# Patient Record
Sex: Female | Born: 1995 | Race: White | Hispanic: No | Marital: Single | Smoking: Never smoker
Health system: Southern US, Community
[De-identification: ages and names within clinical notes are randomized; demographics above are authoritative.]

---

## 2017-12-13 ENCOUNTER — Other Ambulatory Visit: Payer: Self-pay

## 2017-12-13 ENCOUNTER — Encounter (HOSPITAL_BASED_OUTPATIENT_CLINIC_OR_DEPARTMENT_OTHER): Payer: Self-pay | Admitting: Emergency Medicine

## 2017-12-13 ENCOUNTER — Emergency Department (HOSPITAL_BASED_OUTPATIENT_CLINIC_OR_DEPARTMENT_OTHER)
Admission: EM | Admit: 2017-12-13 | Discharge: 2017-12-14 | Disposition: A | Discharge status: Home / Self Care | Payer: Managed Care, Other (non HMO) | Attending: Emergency Medicine | Admitting: Emergency Medicine

## 2017-12-13 DIAGNOSIS — Z3202 Encounter for pregnancy test, result negative: Secondary | ICD-10-CM | POA: Insufficient documentation

## 2017-12-13 DIAGNOSIS — R000 Tachycardia, unspecified: Secondary | ICD-10-CM | POA: Diagnosis not present

## 2017-12-13 DIAGNOSIS — R7989 Other specified abnormal findings of blood chemistry: Secondary | ICD-10-CM | POA: Insufficient documentation

## 2017-12-13 DIAGNOSIS — R112 Nausea with vomiting, unspecified: Secondary | ICD-10-CM | POA: Diagnosis present

## 2017-12-13 DIAGNOSIS — K529 Noninfective gastroenteritis and colitis, unspecified: Secondary | ICD-10-CM | POA: Insufficient documentation

## 2017-12-13 MED ORDER — ONDANSETRON HCL 4 MG/2ML IJ SOLN
4.0000 mg | Freq: Once | INTRAMUSCULAR | Status: AC
  Administered 2017-12-14: 4 mg via INTRAVENOUS
  Filled 2017-12-13: qty 2

## 2017-12-13 MED ORDER — SODIUM CHLORIDE 0.9 % IV BOLUS
1000.0000 mL | Freq: Once | INTRAVENOUS | Status: AC
  Administered 2017-12-14: 1000 mL via INTRAVENOUS

## 2017-12-13 NOTE — ED Triage Notes (Signed)
Patient brought in via ems for N/V/D - was found to have hypotension at Home and was given 2 500cc bolus. The patient currently is normal tensive after the fluid. The patient is a/o x 4

## 2017-12-13 NOTE — ED Provider Notes (Signed)
[facility] EMERGENCY DEPARTMENT Provider Note   CSN: [id number] Arrival date & time: 12/13/17  2319     History   Chief Complaint No chief complaint on file.   HPI Helen Bell is a 22 y.o. female.  High [employer] student presenting by EMS with sudden onset nausea vomiting diarrhea and abdominal pain.  She states she believes she has food poisoning after eating pork in the cafeteria.  No other sick contacts.  No recent travel or antibiotic use.  She states she got sick around 8 PM and is vomited too many times to count associate with diarrhea.  She did have a syncopal episode on EMS arrival.  Denies hitting her head.  Denies any chest pain or shortness of breath.   denies any possibility of pregnancy.  Denies any other medical problems.  The history is provided by the patient and the EMS personnel.    History reviewed. No pertinent past medical history.  There are no active problems to display for this patient.   History reviewed. No pertinent surgical history.   OB History   None      Home Medications    Prior to Admission medications   Not on File    Family History History reviewed. No pertinent family history.  Social History Social History   Tobacco Use  . Smoking status: Never Smoker  . Smokeless tobacco: Never Used  Substance Use Topics  . Alcohol use: Never    Frequency: Never  . Drug use: Never     Allergies   Patient has no known allergies.   Review of Systems Review of Systems  Constitutional: Positive for activity change and appetite change. Negative for fever.  HENT: Negative for congestion.   Respiratory: Negative for cough, chest tightness and shortness of breath.   Gastrointestinal: Positive for abdominal pain, diarrhea, nausea and vomiting.  Genitourinary: Negative for dysuria and hematuria.  Musculoskeletal: Negative for myalgias.  Skin: Negative for rash.  Neurological: Positive for syncope and weakness.  Negative for dizziness and headaches.   all other systems are negative except as noted in the HPI and PMH.     Physical Exam Updated Vital Signs BP 95/62 (BP Location: Left Arm)   Pulse (!) 106   Temp 98.4 F (36.9 C) (Oral)   Resp 16   SpO2 98%   Physical Exam  Constitutional: She is oriented to person, place, and time. She appears well-developed and well-nourished. No distress.  Actively vomiting, pale appearing  HENT:  Head: Normocephalic and atraumatic.  Mouth/Throat: Oropharynx is clear and moist. No oropharyngeal exudate.  Eyes: Pupils are equal, round, and reactive to light. Conjunctivae and EOM are normal.  Neck: Normal range of motion. Neck supple.  No meningismus.  Cardiovascular: Normal rate, normal heart sounds and intact distal pulses.  No murmur heard. Tachycardic 120s  Pulmonary/Chest: Effort normal and breath sounds normal. No respiratory distress.  Abdominal: Soft. There is no tenderness. There is no rebound and no guarding.  Musculoskeletal: Normal range of motion. She exhibits no edema or tenderness.  Neurological: She is alert and oriented to person, place, and time. No cranial nerve deficit. She exhibits normal muscle tone. Coordination normal.   5/5 strength throughout. CN 2-12 intact.Equal grip strength.   Skin: Skin is warm.  Psychiatric: She has a normal mood and affect. Her behavior is normal.  Nursing note and vitals reviewed.    ED Treatments / Results  Labs (all labs ordered are listed, but only abnormal  results are displayed) Labs Reviewed  CBC WITH DIFFERENTIAL/PLATELET - Abnormal; Notable for the following components:      Result Value   WBC 11.9 (*)    Neutro Abs 9.9 (*)    All other components within normal limits  COMPREHENSIVE METABOLIC PANEL - Abnormal; Notable for the following components:   Glucose, Bld 139 (*)    Calcium 8.5 (*)    All other components within normal limits  URINALYSIS, ROUTINE W REFLEX MICROSCOPIC - Abnormal;  Notable for the following components:   APPearance CLOUDY (*)    Specific Gravity, Urine >1.030 (*)    Hgb urine dipstick MODERATE (*)    Ketones, ur 15 (*)    Protein, ur 30 (*)    All other components within normal limits  URINALYSIS, MICROSCOPIC (REFLEX) - Abnormal; Notable for the following components:   Bacteria, UA RARE (*)    Squamous Epithelial / LPF 6-30 (*)    All other components within normal limits  D-DIMER, QUANTITATIVE (NOT AT [facility]) - Abnormal; Notable for the following components:   D-Dimer, Quant 1.02 (*)    All other components within normal limits  LIPASE, BLOOD  PREGNANCY, URINE    EKG EKG Interpretation  Date/Time:  Tuesday December 14 2017 08:15:30 EDT Ventricular Rate:  107 PR Interval:    QRS Duration: 88 QT Interval:  336 QTC Calculation: 449 R Axis:   44 Text Interpretation:  Sinus tachycardia No old tracing to compare Confirmed by Melene Plan [phone]) on 12/14/2017 8:38:23 AM   Radiology Dg Chest 2 View  Result Date: 12/14/2017 CLINICAL DATA:  22 year old female with nausea and shortness of breath. EXAM: CHEST - 2 VIEW COMPARISON:  None. FINDINGS: The heart size and mediastinal contours are within normal limits. Both lungs are clear. The visualized skeletal structures are unremarkable. IMPRESSION: No active cardiopulmonary disease. Electronically Signed   By: Elgie Collard M.D.   On: 12/14/2017 05:51    Procedures Procedures (including critical care time)  Medications Ordered in ED Medications  sodium chloride 0.9 % bolus 1,000 mL (has no administration in time range)  ondansetron (ZOFRAN) injection 4 mg (has no administration in time range)     Initial Impression / Assessment and Plan / ED Course  I have reviewed the triage vital signs and the nursing notes.  Pertinent labs & imaging results that were available during my care of the patient were reviewed by me and considered in my medical decision making (see chart for details).    4 hours  of nausea, vomiting, diarrhea.  Suspect viral gastroenteritis versus foodborne illness.  Abdomen is benign.  Patient will be hydrated and treated symptomatically.  Patient given IV fluids and antiemetics.  Initially tachycardic in the 130s.  This is improved with fluids.  Blood pressure has stable.  Abdomen is soft and nontender.  Labs are reassuring.  Patient tolerating p.o. without further vomiting in the ED.  On attempted discharge, patient states she said cough and URI symptoms for the past 1 week.  She states she feels like her "lungs are heavy" and she has trouble taking a deep breath. She is on birth control and is persistently tachycardic despite fluid resuscitation.  D-dimer was obtained and is positive.  Will plan for CT angiogram to rule out PE.  However CT is not functioning at this time.  Patient may need to be transferred for CT if not available.  Dr. Adela Lank to assume care at shift change.    Final Clinical Impressions(s) /  ED Diagnoses   Final diagnoses:  Gastroenteritis  Tachycardia  Positive D-dimer    ED Discharge Orders        Ordered    ondansetron (ZOFRAN) 4 MG tablet  Every 6 hours     12/14/17 0801       Glynn Octaveancour, Aanya Haynes, MD 12/14/17 [phone]

## 2017-12-14 ENCOUNTER — Emergency Department (HOSPITAL_BASED_OUTPATIENT_CLINIC_OR_DEPARTMENT_OTHER): Payer: Managed Care, Other (non HMO)

## 2017-12-14 ENCOUNTER — Emergency Department (HOSPITAL_COMMUNITY): Payer: Managed Care, Other (non HMO)

## 2017-12-14 ENCOUNTER — Encounter (HOSPITAL_COMMUNITY): Payer: Self-pay | Admitting: Radiology

## 2017-12-14 LAB — CBC WITH DIFFERENTIAL/PLATELET
BASOS ABS: 0 10*3/uL (ref 0.0–0.1)
Basophils Relative: 0 %
Eosinophils Absolute: 0.1 10*3/uL (ref 0.0–0.7)
Eosinophils Relative: 1 %
HEMATOCRIT: 39.9 % (ref 36.0–46.0)
Hemoglobin: 13.7 g/dL (ref 12.0–15.0)
LYMPHS ABS: 0.8 10*3/uL (ref 0.7–4.0)
LYMPHS PCT: 7 %
MCH: 30.2 pg (ref 26.0–34.0)
MCHC: 34.3 g/dL (ref 30.0–36.0)
MCV: 88.1 fL (ref 78.0–100.0)
MONO ABS: 1 10*3/uL (ref 0.1–1.0)
MONOS PCT: 9 %
NEUTROS ABS: 9.9 10*3/uL — AB (ref 1.7–7.7)
Neutrophils Relative %: 83 %
Platelets: 289 10*3/uL (ref 150–400)
RBC: 4.53 MIL/uL (ref 3.87–5.11)
RDW: 12.1 % (ref 11.5–15.5)
WBC: 11.9 10*3/uL — ABNORMAL HIGH (ref 4.0–10.5)

## 2017-12-14 LAB — URINALYSIS, ROUTINE W REFLEX MICROSCOPIC
Bilirubin Urine: NEGATIVE
GLUCOSE, UA: NEGATIVE mg/dL
KETONES UR: 15 mg/dL — AB
Leukocytes, UA: NEGATIVE
Nitrite: NEGATIVE
PROTEIN: 30 mg/dL — AB
Specific Gravity, Urine: >1.03 — ABNORMAL HIGH (ref 1.005–1.030)
pH: 6 (ref 5.0–8.0)

## 2017-12-14 LAB — COMPREHENSIVE METABOLIC PANEL
ALT: 16 U/L (ref 14–54)
AST: 21 U/L (ref 15–41)
Albumin: 3.5 g/dL (ref 3.5–5.0)
Alkaline Phosphatase: 48 U/L (ref 38–126)
Anion gap: 12 (ref 5–15)
BUN: 16 mg/dL (ref 6–20)
CO2: 22 mmol/L (ref 22–32)
CREATININE: 0.88 mg/dL (ref 0.44–1.00)
Calcium: 8.5 mg/dL — ABNORMAL LOW (ref 8.9–10.3)
Chloride: 104 mmol/L (ref 101–111)
GFR calc Af Amer: >60 mL/min (ref >60)
Glucose, Bld: 139 mg/dL — ABNORMAL HIGH (ref 65–99)
POTASSIUM: 3.9 mmol/L (ref 3.5–5.1)
Sodium: 138 mmol/L (ref 135–145)
Total Bilirubin: 0.3 mg/dL (ref 0.3–1.2)
Total Protein: 7.1 g/dL (ref 6.5–8.1)

## 2017-12-14 LAB — URINALYSIS, MICROSCOPIC (REFLEX): WBC, UA: NONE SEEN WBC/hpf (ref 0–5)

## 2017-12-14 LAB — LIPASE, BLOOD: LIPASE: 27 U/L (ref 11–51)

## 2017-12-14 LAB — PREGNANCY, URINE: Preg Test, Ur: NEGATIVE

## 2017-12-14 LAB — D-DIMER, QUANTITATIVE (NOT AT ARMC): D DIMER QUANT: 1.02 ug{FEU}/mL — AB (ref 0.00–0.50)

## 2017-12-14 MED ORDER — IOPAMIDOL (ISOVUE-370) INJECTION 76%
INTRAVENOUS | Status: AC
  Administered 2017-12-14: 100 mL via INTRAVENOUS
  Filled 2017-12-14: qty 100

## 2017-12-14 MED ORDER — PROMETHAZINE HCL 25 MG/ML IJ SOLN
25.0000 mg | Freq: Once | INTRAMUSCULAR | Status: AC
  Administered 2017-12-14: 25 mg via INTRAVENOUS
  Filled 2017-12-14: qty 1

## 2017-12-14 MED ORDER — SODIUM CHLORIDE 0.9 % IV BOLUS
1000.0000 mL | Freq: Once | INTRAVENOUS | Status: AC
  Administered 2017-12-14: 1000 mL via INTRAVENOUS

## 2017-12-14 MED ORDER — IOPAMIDOL (ISOVUE-370) INJECTION 76%
100.0000 mL | Freq: Once | INTRAVENOUS | Status: AC | PRN
  Administered 2017-12-14: 100 mL via INTRAVENOUS

## 2017-12-14 MED ORDER — ONDANSETRON 4 MG PO TBDP: ORAL_TABLET | ORAL | 0 refills | Status: AC

## 2017-12-14 MED ORDER — ACETAMINOPHEN 325 MG PO TABS
650.0000 mg | ORAL_TABLET | Freq: Once | ORAL | Status: AC
  Administered 2017-12-14: 650 mg via ORAL
  Filled 2017-12-14: qty 2

## 2017-12-14 MED ORDER — ONDANSETRON HCL 4 MG/2ML IJ SOLN
4.0000 mg | Freq: Once | INTRAMUSCULAR | Status: AC
  Administered 2017-12-14: 4 mg via INTRAVENOUS
  Filled 2017-12-14: qty 2

## 2017-12-14 MED ORDER — ONDANSETRON HCL 4 MG PO TABS: 4.0000 mg | ORAL_TABLET | Freq: Four times a day (QID) | ORAL | 0 refills | Status: AC

## 2017-12-14 NOTE — ED Notes (Signed)
Bed: [facility] Expected date:  Expected time:  Means of arrival:  Comments: 

## 2017-12-14 NOTE — ED Notes (Signed)
Pt states " I am still nauseas." EDP notified.

## 2017-12-14 NOTE — ED Provider Notes (Signed)
11:53 AM Assumed care from Drs. Floyd and Rancour, please see their note for full history, physical and decision making until this point. In brief this is a 22 y.o. year old female who presented to the ED tonight with No chief complaint on file.     Had been in ED for gastroenteritis. Found to be persistently tachycardic. D dimer positive, sent here for PE scan which is negative.  Patient had multiple workups (per her report) for tachycardia in the past to include cardiology appt, echos, ecg's, lab work, etc. No cause has been found. With this information, I think it is likel normal for her to have elevated HR. Will not workup further. Tolerating PO. Stable for dc.   Discharge instructions, including strict return precautions for new or worsening symptoms, given. Patient and/or family verbalized understanding and agreement with the plan as described.   Labs, studies and imaging reviewed by myself and considered in medical decision making if ordered. Imaging interpreted by radiology.  Labs Reviewed  CBC WITH DIFFERENTIAL/PLATELET - Abnormal; Notable for the following components:      Result Value   WBC 11.9 (*)    Neutro Abs 9.9 (*)    All other components within normal limits  COMPREHENSIVE METABOLIC PANEL - Abnormal; Notable for the following components:   Glucose, Bld 139 (*)    Calcium 8.5 (*)    All other components within normal limits  URINALYSIS, ROUTINE W REFLEX MICROSCOPIC - Abnormal; Notable for the following components:   APPearance CLOUDY (*)    Specific Gravity, Urine >1.030 (*)    Hgb urine dipstick MODERATE (*)    Ketones, ur 15 (*)    Protein, ur 30 (*)    All other components within normal limits  URINALYSIS, MICROSCOPIC (REFLEX) - Abnormal; Notable for the following components:   Bacteria, UA RARE (*)    Squamous Epithelial / LPF 6-30 (*)    All other components within normal limits  D-DIMER, QUANTITATIVE (NOT [facility]) - Abnormal; Notable for the following components:    D-Dimer, Quant 1.02 (*)    All other components within normal limits  LIPASE, BLOOD  PREGNANCY, URINE    CT Angio Chest PE W and/or Wo Contrast  Final Result    DG Chest 2 View  Final Result      No follow-ups on file.    Marily MemosMesner, Edward Trevino, MD 12/14/17 [phone]

## 2017-12-14 NOTE — ED Provider Notes (Addendum)
22 yo F with a chief complaint of nausea vomiting diarrhea.  This been going on for a few days.  She also mentioned that she was having some heaviness with breathing and was persistently tachycardic.  I received the patient in signout from Dr. Esmeralda Linksrank for, she had a positive d-dimer and the plan was for a CT angiogram of the chest to rule out pulmonary embolism.  Unfortunately the CT scanner is not functional and there is no known time that it will be back up therefore I will transfer the patient for a CT was a long emergency department.  She had an EKG that shows sinus tachycardia.  She is gotten 3 L of IV fluid without improvement.  Discussed with Dr. Clayborne DanaMesner who accepts in transfer   EKG Interpretation  Date/Time:  Tuesday December 14 2017 08:15:30 EDT Ventricular Rate:  107 PR Interval:    QRS Duration: 88 QT Interval:  336 QTC Calculation: 449 R Axis:   44 Text Interpretation:  Sinus tachycardia No old tracing to compare Confirmed by Melene PlanFloyd, Keyla Milone [phone]) on 12/14/2017 8:38:23 AM         Melene PlanFloyd, Derica Leiber, DO 12/14/17 [phone]    Melene PlanFloyd, Daysy Santini, DO 12/14/17 [phone]

## 2017-12-14 NOTE — ED Notes (Signed)
Bed: [facility]1 Expected date:  Expected time:  Means of arrival:  Comments: 

## 2017-12-14 NOTE — Discharge Instructions (Addendum)
[facility]  Let them know you were sent there for CT.

## 2017-12-16 ENCOUNTER — Telehealth: Payer: Self-pay | Admitting: *Deleted

## 2017-12-16 NOTE — Telephone Encounter (Signed)
Pharmacy called related to Rx faxed to [place].  [place]

## 2019-05-14 IMAGING — CT CT ANGIO CHEST
2 of 6 series · 19 of 46 positions shown · IV contrast (ISOVUE)
Comparison: Chest x-ray from same day.

CLINICAL DATA: Sudden onset nausea, vomiting, diarrhea, and
abdominal pain. No chest pain or shortness of breath. Elevated
D-dimer.

EXAM:
CT ANGIOGRAPHY CHEST WITH CONTRAST
TECHNIQUE: Multidetector CT imaging of the chest was performed using the
standard protocol during bolus administration of intravenous
contrast. Multiplanar CT image reconstructions and MIPs were
obtained to evaluate the vascular anatomy.
CONTRAST:  100mL V2EEM0-SK0 IOPAMIDOL (V2EEM0-SK0) INJECTION 76%

[Series 5: thins · axial · 0.64mm/px · z∈[+1381,+1639]mm · 16 of 284 slices shown]
[im 13/284  lung]
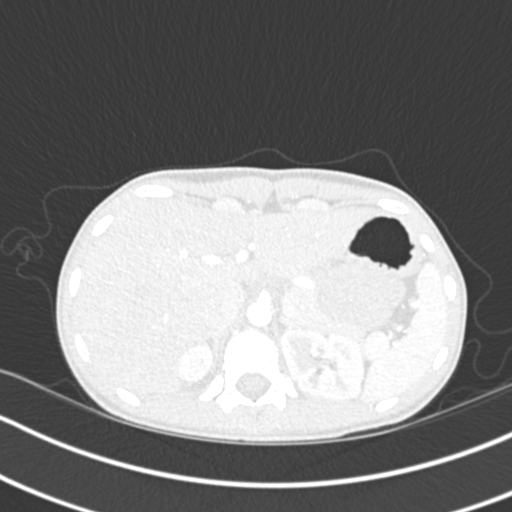
[im 37/284  soft-tissue]
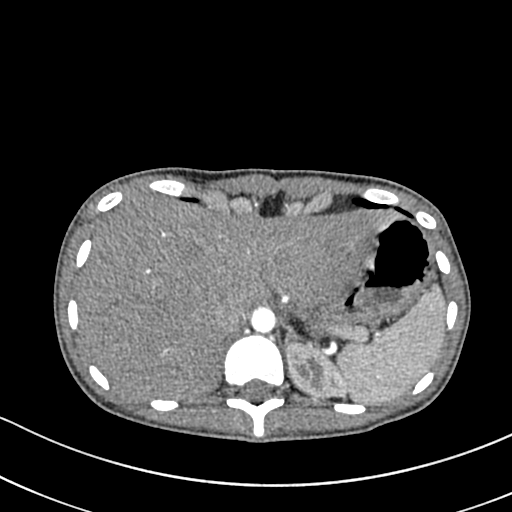
[im 50/284  lung]
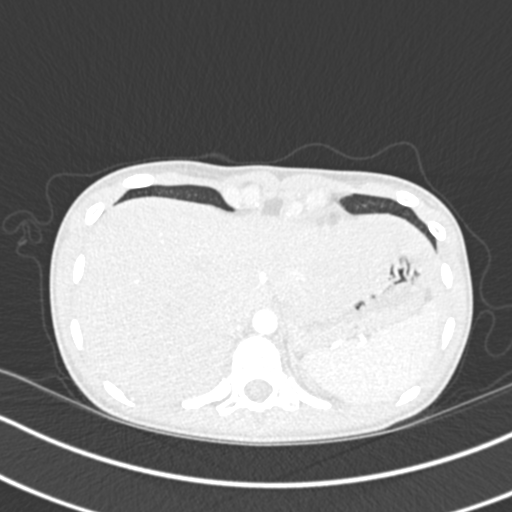
[im 62/284  soft-tissue]
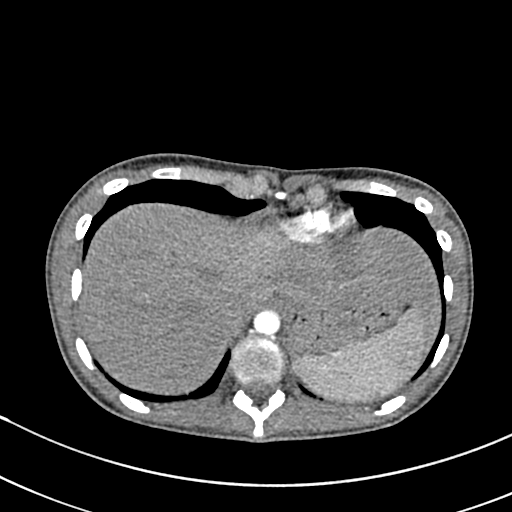
[im 87/284  lung]
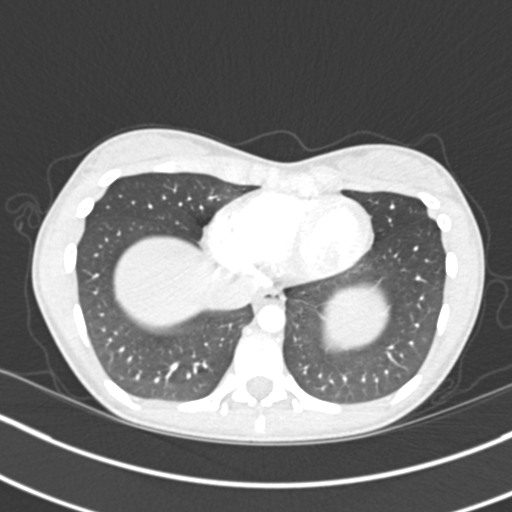
[im 99/284  soft-tissue]
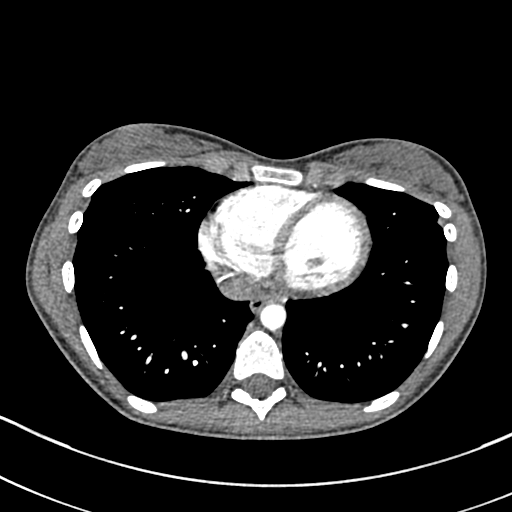
[im 111/284  lung]
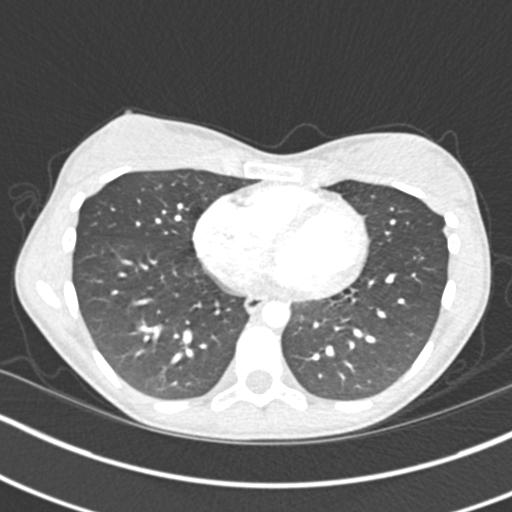
[im 136/284  soft-tissue]
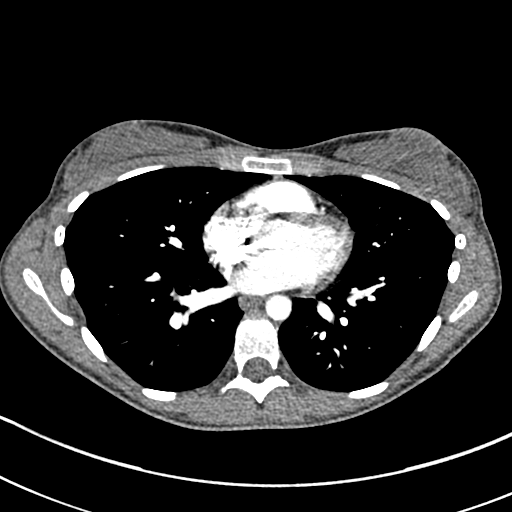
[im 148/284  lung]
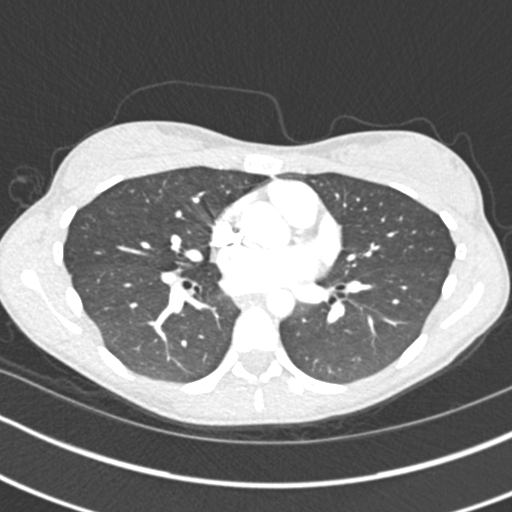
[im 173/284  soft-tissue]
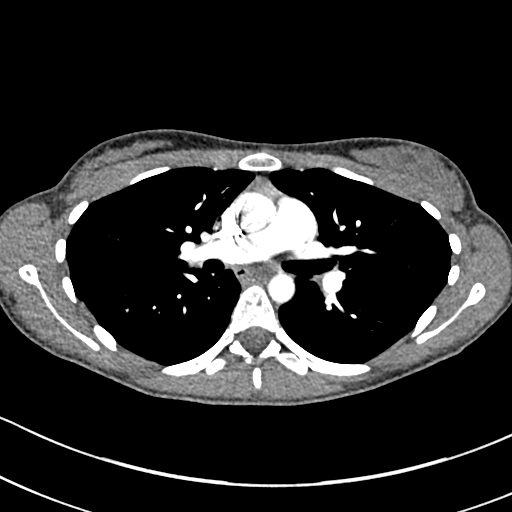
[im 185/284  lung]
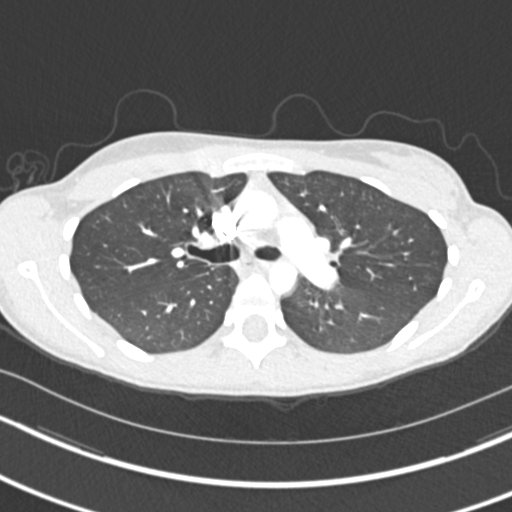
[im 197/284  soft-tissue]
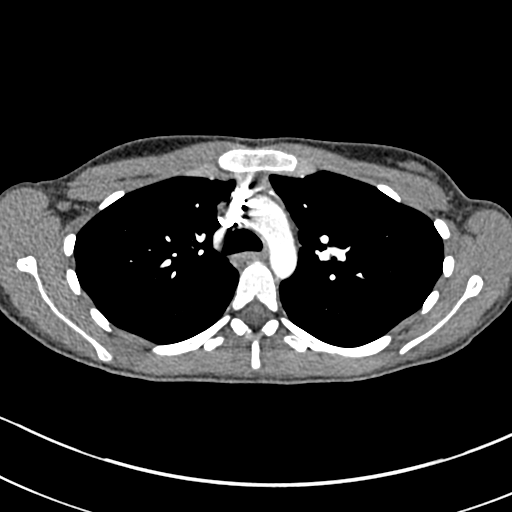
[im 222/284  lung]
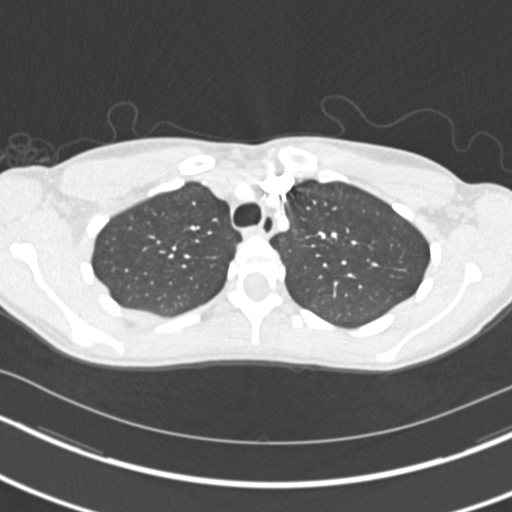
[im 234/284  soft-tissue]
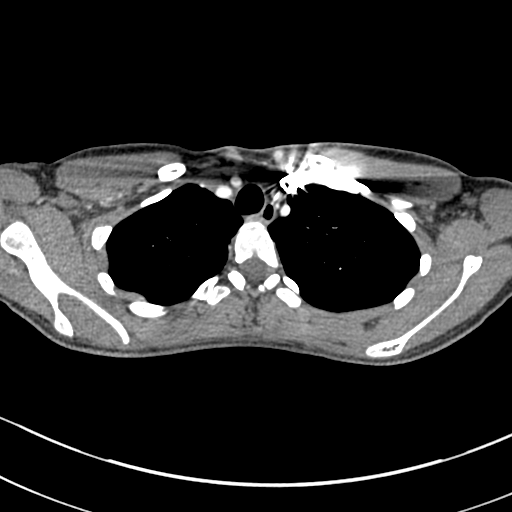
[im 247/284  lung]
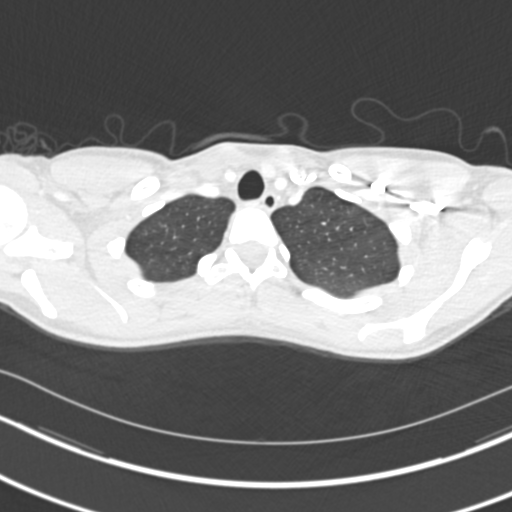
[im 271/284  soft-tissue]
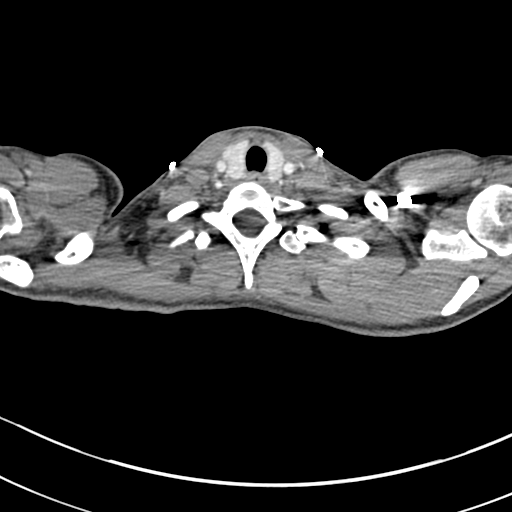

[Series 6: coronal mpr · coronal · 0.58mm/px · 3 of 151 slices shown]
[im 38/151  soft-tissue]
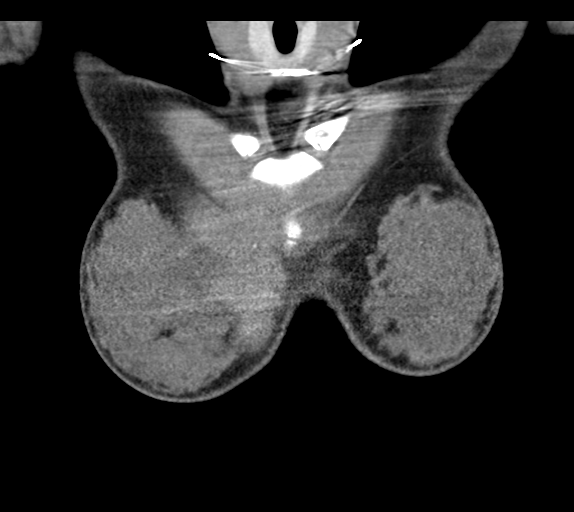
[im 76/151  soft-tissue]
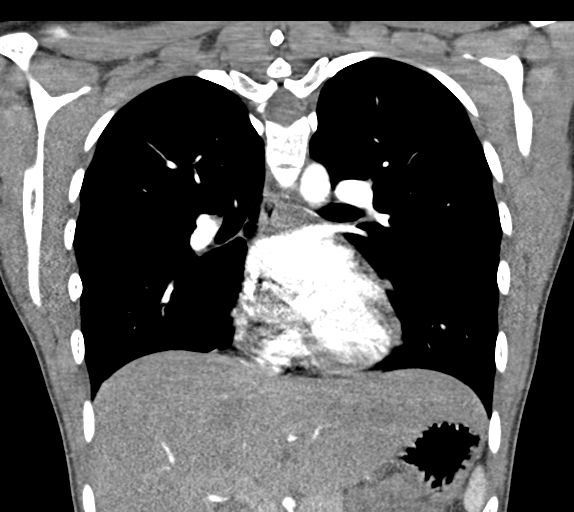
[im 113/151  soft-tissue]
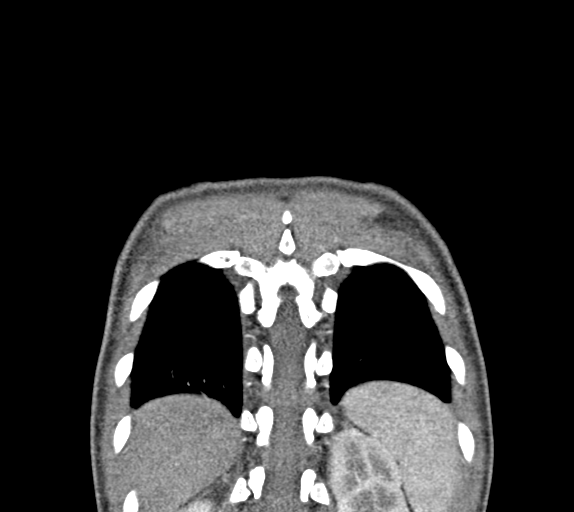

[19 of 46 positions shown; findings below may reference images not displayed]

FINDINGS: Cardiovascular: Satisfactory opacification of the pulmonary arteries
to the segmental level. No evidence of pulmonary embolism. Normal
heart size. No pericardial effusion. Normal caliber thoracic aorta.

Mediastinum/Nodes: No enlarged mediastinal, hilar, or axillary lymph
nodes. Thyroid gland, trachea, and esophagus demonstrate no
significant findings.

Lungs/Pleura: Lungs are clear. No pleural effusion or pneumothorax.
No suspicious pulmonary nodule.

Upper Abdomen: No acute abnormality.

Musculoskeletal: No chest wall abnormality. No acute or significant
osseous findings.

Review of the MIP images confirms the above findings.
IMPRESSION: 1. Normal CTA of the chest.  No evidence of pulmonary embolism.

## 2019-12-16 ENCOUNTER — Ambulatory Visit: Payer: Managed Care, Other (non HMO)
